# Patient Record
Sex: Male | Born: 2000
Health system: Southern US, Community
[De-identification: ages and names within clinical notes are randomized; demographics above are authoritative.]

## PROBLEM LIST (undated history)

## (undated) DIAGNOSIS — F840 Autistic disorder: Secondary | ICD-10-CM

## (undated) DIAGNOSIS — R159 Full incontinence of feces: Secondary | ICD-10-CM

## (undated) DIAGNOSIS — F429 Obsessive-compulsive disorder, unspecified: Secondary | ICD-10-CM

## (undated) HISTORY — DX: Full incontinence of feces: R15.9

## (undated) HISTORY — DX: Obsessive-compulsive disorder, unspecified: F42.9

## (undated) HISTORY — DX: Autistic disorder: F84.0

---
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
## 2020-07-11 ENCOUNTER — Other Ambulatory Visit: Payer: Self-pay

## 2020-07-11 ENCOUNTER — Ambulatory Visit
Admission: RE | Admit: 2020-07-11 | Discharge: 2020-07-11 | Disposition: A | Discharge status: Home / Self Care | Payer: Medicaid Other | Source: Ambulatory Visit | Attending: Family Medicine | Admitting: Family Medicine

## 2020-07-11 VITALS — BP 152/89 | HR 97 | Temp 98.2°F | Resp 16

## 2020-07-11 DIAGNOSIS — H1033 Unspecified acute conjunctivitis, bilateral: Secondary | ICD-10-CM | POA: Diagnosis not present

## 2020-07-11 MED ORDER — POLYMYXIN B-TRIMETHOPRIM 10000-0.1 UNIT/ML-% OP SOLN: 1.0000 [drp] | OPHTHALMIC | 0 refills | Status: AC

## 2020-07-11 NOTE — Discharge Instructions (Signed)
Use the antibiotic eyedrops as prescribed.    Follow-up with your eye doctor for a recheck in 1 to 2 days if your symptoms are not improving.    Go to the emergency department if you have acute eye pain, changes in your vision, or other concerning symptoms.    

## 2020-07-11 NOTE — ED Triage Notes (Signed)
Both eye red since yesterday, this morning eyes were matted shut.

## 2020-07-12 NOTE — ED Provider Notes (Signed)
[facility]   [id number] 07/11/20 Arrival Time: 1043  CC: EYE REDNESS  SUBJECTIVE:  [name] is a 20 y.o. male who presents with complaint of eye redness that began yesterday after water activities. Has not tried OTC saline . There are not aggravating or alleviating factors. States that lashes on both eyes were matted together this morning. Denies similar symptoms in the past. Denies fever, chills, nausea, vomiting, eye pain, painful eye movements, halos, discharge, itching, vision changes, double vision, FB sensation, periorbital erythema.   Denies contact lens use.    ROS: As per HPI.  All other pertinent ROS negative.     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   Current Outpatient Medications on File Prior to Encounter  Medication Sig Dispense Refill   benzonatate (TESSALON) 100 MG capsule Take 1 capsule (100 mg total) by mouth 2 (two) times daily as needed for cough. 20 capsule 1   cloNIDine (CATAPRES) 0.2 MG tablet Take 0.2 mg by mouth at bedtime.     FLUoxetine (PROZAC) 20 MG tablet      fluticasone (FLONASE) 50 MCG/ACT nasal spray Place 1 spray into both nostrils daily. 16 g 4   loratadine (CLARITIN) 10 MG tablet Take 1 tablet (10 mg total) by mouth daily. 30 tablet 5   mometasone (NASONEX) 50 MCG/ACT nasal spray Place 2 sprays into the nose daily. 17 g 5   montelukast (SINGULAIR) 10 MG tablet Take 1 tablet (10 mg total) by mouth at bedtime. 30 tablet 0   polyethylene glycol powder (GLYCOLAX/MIRALAX) powder Take 17 g by mouth daily.       QUEtiapine (SEROQUEL XR) 300 MG 24 hr tablet Take 300 mg by mouth at  bedtime.     QUEtiapine Fumarate (SEROQUEL XR) 150 MG 24 hr tablet Take 150 mg by mouth daily.     senna (SENOKOT) 8.6 MG TABS tablet Take 1 tablet (8.6 mg total) by mouth daily. 100 tablet    traZODone (DESYREL) 100 MG tablet      Social History   Socioeconomic History   Marital status: Single    Spouse name: Not on file   Number of children: Not on file   Years of education: Not on file   Highest education level: Not on file  Occupational History   Not on file  Tobacco Use   Smoking status: Never   Smokeless tobacco: Never  Substance and Sexual Activity   Alcohol use: No   Drug use: No   Sexual activity: Never  Other Topics Concern   Not on file  Social History Narrative   Completed 5th grade   Social Determinants of Health   Financial Resource Strain: Not on file  Food Insecurity: Not on file  Transportation Needs: Not on file  Physical Activity: Not on file  Stress: Not on file  Social Connections: Not on file  Intimate Partner Violence: Not on file   Family History  Problem Relation Age of Onset   Hirschsprung's disease Neg Hx     OBJECTIVE:    Visual Acuity :      Vitals:   07/11/20 1116  BP: (!) 152/89  Pulse: 97  Resp: 16  Temp: 98.2 F (36.8 C)  TempSrc: Oral  SpO2: 97%    General appearance: alert; no distress Eyes: No conjunctival erythema. PERRL; EOMI without discomfort;  no obvious drainage; lid everted without obvious FB; no obvious fluorescein uptake  Neck: supple Lungs: clear to auscultation bilaterally Heart: regular rate and rhythm Skin: warm and dry Psychological: alert and cooperative; normal mood and affect   ASSESSMENT & PLAN:  1. Acute bacterial conjunctivitis of both eyes     Meds ordered this encounter  Medications   trimethoprim-polymyxin b (POLYTRIM) ophthalmic solution    Sig: Place 1 drop into both eyes every 4 (four) hours for 5 days.    Dispense:  10 mL    Refill:  0    Order Specific Question:   Supervising  Provider    Answer:   Merrilee Jansky [[id number]]     Conjunctivitis Use eye drops as prescribed and to completion Dispose of old contacts and wear glasses until you have finished course of antibiotic eye drops Wash pillow cases, wash hands regularly with soap and water, avoid touching your face and eyes, wash door handles, light switches, remotes and other objects you frequently touch Return or follow up with PCP if symptoms persists such as fever, chills, redness, swelling, eye pain, painful eye movements, vision changes Reviewed expectations re: course of current medical issues. Questions answered. Outlined signs and symptoms indicating need for more acute intervention. Patient verbalized understanding. After Visit Summary given.    Moshe Cipro, NP 07/12/20 [phone]
